# Patient Record
Sex: Female | Born: 1995 | Race: Black or African American | Hispanic: No | Marital: Single | Smoking: Current some day smoker
Health system: Southern US, Community
[De-identification: ages and names within clinical notes are randomized; demographics above are authoritative.]

## PROBLEM LIST (undated history)

## (undated) DIAGNOSIS — B977 Papillomavirus as the cause of diseases classified elsewhere: Secondary | ICD-10-CM

## (undated) HISTORY — PX: NO PAST SURGERIES: SHX2092

---

## 2016-09-03 ENCOUNTER — Encounter (HOSPITAL_COMMUNITY): Payer: Self-pay | Admitting: *Deleted

## 2016-09-03 ENCOUNTER — Inpatient Hospital Stay (HOSPITAL_COMMUNITY)
Admission: EM | Admit: 2016-09-03 | Discharge: 2016-09-04 | Disposition: A | Discharge status: Home / Self Care | Payer: Self-pay | Attending: Obstetrics and Gynecology | Admitting: Obstetrics and Gynecology

## 2016-09-03 DIAGNOSIS — S3141XA Laceration without foreign body of vagina and vulva, initial encounter: Secondary | ICD-10-CM | POA: Insufficient documentation

## 2016-09-03 DIAGNOSIS — N939 Abnormal uterine and vaginal bleeding, unspecified: Secondary | ICD-10-CM

## 2016-09-03 DIAGNOSIS — F17200 Nicotine dependence, unspecified, uncomplicated: Secondary | ICD-10-CM | POA: Insufficient documentation

## 2016-09-03 DIAGNOSIS — X58XXXA Exposure to other specified factors, initial encounter: Secondary | ICD-10-CM | POA: Insufficient documentation

## 2016-09-03 HISTORY — DX: Papillomavirus as the cause of diseases classified elsewhere: B97.7

## 2016-09-03 LAB — URINALYSIS, ROUTINE W REFLEX MICROSCOPIC
GLUCOSE, UA: NEGATIVE mg/dL
Ketones, ur: 15 mg/dL — AB
Nitrite: POSITIVE — AB
SPECIFIC GRAVITY, URINE: 1.02 (ref 1.005–1.030)
pH: 6.5 (ref 5.0–8.0)

## 2016-09-03 LAB — CBC
HCT: 35.3 % — ABNORMAL LOW (ref 36.0–46.0)
HEMOGLOBIN: 11.1 g/dL — AB (ref 12.0–15.0)
MCH: 25.1 pg — ABNORMAL LOW (ref 26.0–34.0)
MCHC: 31.4 g/dL (ref 30.0–36.0)
MCV: 79.7 fL (ref 78.0–100.0)
PLATELETS: 386 10*3/uL (ref 150–400)
RBC: 4.43 MIL/uL (ref 3.87–5.11)
RDW: 13.9 % (ref 11.5–15.5)
WBC: 8.2 10*3/uL (ref 4.0–10.5)

## 2016-09-03 LAB — POC URINE PREG, ED: PREG TEST UR: NEGATIVE

## 2016-09-03 LAB — COMPREHENSIVE METABOLIC PANEL
ALT: 18 U/L (ref 14–54)
ANION GAP: 8 (ref 5–15)
AST: 27 U/L (ref 15–41)
Albumin: 4.3 g/dL (ref 3.5–5.0)
Alkaline Phosphatase: 62 U/L (ref 38–126)
BUN: 10 mg/dL (ref 6–20)
CHLORIDE: 109 mmol/L (ref 101–111)
CO2: 22 mmol/L (ref 22–32)
CREATININE: 0.78 mg/dL (ref 0.44–1.00)
Calcium: 9.8 mg/dL (ref 8.9–10.3)
Glucose, Bld: 118 mg/dL — ABNORMAL HIGH (ref 65–99)
Potassium: 3.8 mmol/L (ref 3.5–5.1)
SODIUM: 139 mmol/L (ref 135–145)
Total Bilirubin: 0.3 mg/dL (ref 0.3–1.2)
Total Protein: 7.4 g/dL (ref 6.5–8.1)

## 2016-09-03 LAB — URINE MICROSCOPIC-ADD ON

## 2016-09-03 NOTE — ED Provider Notes (Signed)
[facility]-EMERGENCY DEPT Provider Note   CSN: [id number] Arrival date & time: 09/03/16  2012     History   Chief Complaint Chief Complaint  Patient presents with  . Vaginal Bleeding    HPI Gabriela Shannon is a 20 y.o. female with no major medical problems presents to the Emergency Department complaining of gradual, persistent, progressively worsening Vaginal pain and vaginal bleeding onset 7:45pm. Associated symptoms include clitoris pain.  Pt reports her menses ended on Friday.  She reports protected sexual intercourse this AM with spotting. Pt does report pain with intercourse this morning and a very small amount of bright red blood at the time.   She reports Immediately putting in a tampon in and keeping it there until this evening.  She reports she felt urinary urgency and noticed a large amount of blood in the toilet after removing the tampon.  She reports lower abd cramping and significant dysuria. Nothing makes it better and nothing makes it worse.  Pt denies fevers, chills headache, neck pain chest pain, SOB, nausea, vomiting.    The history is provided by the patient and medical records. No language interpreter was used.    History reviewed. No pertinent past medical history.  There are no active problems to display for this patient.   History reviewed. No pertinent surgical history.  OB History    No data available       Home Medications    Prior to Admission medications   Not on File    Family History No family history on file.  Social History Social History  Substance Use Topics  . Smoking status: Current Some Day Smoker  . Smokeless tobacco: Never Used  . Alcohol use No     Allergies   Patient has no known allergies.   Review of Systems Review of Systems  Genitourinary: Positive for dysuria, vaginal bleeding and vaginal pain.  All other systems reviewed and are negative.    Physical Exam Updated Vital Signs BP 124/76 (BP Location: Right Arm)    Pulse 74   Temp 98.2 F (36.8 C) (Oral)   Resp 16   Ht 5\' 7"  (1.702 m)   Wt 111.1 kg   LMP 08/31/2016   SpO2 99%   BMI 38.37 kg/m   Physical Exam  Constitutional: She appears well-developed and well-nourished. No distress.  HENT:  Head: Normocephalic and atraumatic.  Eyes: Conjunctivae are normal. No scleral icterus.  Neck: Normal range of motion. Neck supple.  Cardiovascular: Normal rate, regular rhythm and intact distal pulses.   Pulmonary/Chest: Effort normal and breath sounds normal. No respiratory distress. She has no wheezes.  Abdominal: Soft. Bowel sounds are normal. She exhibits no mass. There is no hepatosplenomegaly. There is no tenderness. There is no rebound, no guarding and no CVA tenderness.  Genitourinary: Uterus normal. Pelvic exam was performed with patient supine. There is no rash, tenderness or lesion on the right labia. There is no rash, tenderness or lesion on the left labia. Uterus is not tender. There is bleeding in the vagina. There are signs of injury in the vagina.  Genitourinary Comments: Large amount of bright red blood in the vaginal vault. Patient with exquisite pain on speculum exam. She is difficult to assess due to her weight and extreme tenderness. Unable to directly visualize the laceration however believe it to be located at the 10:00 position.  Vaginal vault packed with moist gauze.    Musculoskeletal: Normal range of motion. She exhibits no edema.  Lymphadenopathy:  She has no cervical adenopathy.  Neurological: She is alert.  Speech is clear and goal oriented Moves extremities without ataxia  Skin: Skin is warm and dry. No rash noted. She is not diaphoretic.  Psychiatric: She has a normal mood and affect.  Nursing note and vitals reviewed.    ED Treatments / Results  Labs (all labs ordered are listed, but only abnormal results are displayed)   Labs Reviewed  COMPREHENSIVE METABOLIC PANEL - Abnormal; Notable for the following:        Result Value   Glucose, Bld 118 (*)    All other components within normal limits  CBC - Abnormal; Notable for the following:    Hemoglobin 11.1 (*)    HCT 35.3 (*)    MCH 25.1 (*)    All other components within normal limits  URINALYSIS, ROUTINE W REFLEX MICROSCOPIC (NOT AT [facility]) - Abnormal; Notable for the following:    Color, Urine RED (*)    APPearance CLOUDY (*)    Hgb urine dipstick LARGE (*)    Bilirubin Urine MODERATE (*)    Ketones, ur 15 (*)    Protein, ur >300 (*)    Nitrite POSITIVE (*)    Leukocytes, UA MODERATE (*)    All other components within normal limits  URINE MICROSCOPIC-ADD ON - Abnormal; Notable for the following:    Squamous Epithelial / LPF 0-5 (*)    Bacteria, UA MANY (*)    All other components within normal limits  WET PREP, GENITAL  URINE CULTURE  POC URINE PREG, ED  GC/CHLAMYDIA PROBE AMP ([place]) NOT AT [facility]     Procedures Procedures (including critical care time)   Medications Ordered in ED Medications - No data to display   Initial Impression / Assessment and Plan / ED Course  I have reviewed the triage vital signs and the nursing notes.  Pertinent labs & imaging results that were available during my care of the patient were reviewed by me and considered in my medical decision making (see chart for details).  Clinical Course as of Sep 03 2344  Wynelle LinkSun Sep 03, 2016  2342 Discussed with Dr. Alysia PennaErvin who will evaluated in the MAU.  Pt requests to go via POV and I believe this is reasonable.  She is stable with stable vital signs.  [HM]  2343 Cuff was not seated correctly on pt's arm.  Manual BP 124/76 BP: (!) 88/67 [HM]  2343 Possible UTI.  Pt with complains of pain with urination.   Leukocytes, UA: (!) MODERATE [HM]  2343 Anemia.  Unknown baseline.  Her menses ended on 11/3 Hemoglobin: (!) 11.1 [HM]    Clinical Course User Index [HM] Dahlia ClientHannah Blandon Offerdahl, PA-C    Pt with vaginal bleeding. Large amount of bright red blood in vaginal vault.  Suspicious for vaginal tear likely at the 10:00 position based on exam.  Vaginal vault packed.  Discussed with OB/GYN who will see patient in the MAU. Patient wishes for transfer via POV. Vital signs are stable. Alert and oriented.  UA concerning for possible UTI.  Will allow MAU to decide about antibiotics.  Final Clinical Impressions(s) / ED Diagnoses   Final diagnoses:  Vaginal laceration, initial encounter  Vaginal bleeding    New Prescriptions New Prescriptions   No medications on file     Dierdre ForthHannah Idaliz Tinkle, PA-C 09/03/16 2352    Dione Boozeavid Glick, MD 09/04/16 [id number]

## 2016-09-03 NOTE — ED Triage Notes (Signed)
The pt has had vaginal bleeding for 20-30 minutes heavy with clots   She just finished her period  lmp 2 days ago

## 2016-09-04 ENCOUNTER — Inpatient Hospital Stay (HOSPITAL_COMMUNITY): Admission: AD | Admit: 2016-09-04 | Payer: Self-pay | Source: Ambulatory Visit | Admitting: Obstetrics and Gynecology

## 2016-09-04 ENCOUNTER — Encounter (HOSPITAL_COMMUNITY): Payer: Self-pay

## 2016-09-04 DIAGNOSIS — N939 Abnormal uterine and vaginal bleeding, unspecified: Secondary | ICD-10-CM

## 2016-09-04 MED ORDER — OXYCODONE-ACETAMINOPHEN 5-325 MG PO TABS
2.0000 | ORAL_TABLET | Freq: Once | ORAL | Status: AC
Start: 2016-09-04 — End: 2016-09-04
  Administered 2016-09-04: 2 via ORAL
  Filled 2016-09-04: qty 2

## 2016-09-04 NOTE — Discharge Instructions (Signed)
Vaginal Laceration °A vaginal laceration is a tear in the vaginal wall. A vaginal tear falls into one of three categories:  °1. Obstetrically related tears that occur at the time of childbirth. °2. Trauma-related tears (most often related to sexual intercourse). °3. Spontaneous tears. °Vaginal tears can cause heavy bleeding (hemorrhaging) depending on severity of the tear. Tears can be intensely tender and interfere with normal activities of living. They can make sexual intercourse painful and bring on significant burning with urination. If you have a vaginal laceration, the area around your vagina may be painful when you touch or wipe it. Even light pressure from clothing may cause some pain. Vaginal tear need to be evaluated by your caregiver.   °CAUSES  °· Obstetric-related causes, such as childbirth. °· Trauma that may result from an accident during an activity, such as sexual intercourse or a bicycle ride. °· Spontaneous causes related to aging, failed healing of a past obstetric tear, chronic irritation, or skin changes that are not well understood. °SYMPTOMS  °· Slight to heavy vaginal bleeding. °· Vaginal swelling. °· Mild to severe pain. °· Vaginal tenderness. °DIAGNOSIS  °If the tear happened during childbirth, your caregiver can diagnose the tear at that time. To diagnose a vaginal tear that happened spontaneously or because of trauma, your caregiver will perform a physical exam. During the physical exam, your caregiver may also look for any signs of trouble that may need further testing. If there is hemorrhaging, your caregiver may suggest blood tests to determine the extent of bleeding. Imaging tests may be performed, such as an ultrasonography or computed tomography (CT), to look for internal damage. A biopsy may be need if there are signs of a more serious problem.  °TREATMENT  °Treatment depends on the severity of the tear. For minor tears that heal on their own, treatment may only consist of keeping  the area clean and dry. Some tears need to be repaired with stitches. Other tears may heal on their own with help from various remedies, such as antibiotic ointments, medicated creams, or petroleum products. Depending on the circumstances, oral hormones may also be suggested. Hormone remedies may also be in the form of topical creams and vaginal tablets. For more concerning situations, hospitalization and surgical repair of the tear may be needed. °HOME CARE INSTRUCTIONS  °· Take warm-water baths that cover your hips and buttocks (sitz bath) 2 to 3 times a day. This may help any discomfort and swelling.   °· Only take over-the-counter or prescription medicines for pain, discomfort, or fever as directed by your caregiver. Do not use aspirin because it can cause increased bleeding.   °· Do not douche, use tampons, or have intercourse until your caregiver says it is okay.    °· A bandage (dressing) may have been applied. Change the dressing once a day or as directed. If the dressing sticks, soak it off with warm, soapy water.   °· Apply ice or witch hazel pads to the vagina to lessen any pain or discomfort.   °· Take a stool softener or follow a special diet as directed by your caregiver. This will help ease discomfort associated with bowel movements.   °SEEK IMMEDIATE MEDICAL CARE IF:  °· You have redness or swelling in the vaginal area.   °· You have increasing, sharp, or intense pain or tenderness in the vaginal area. °· You have pus or unusual discharge coming from the tear or vagina.   °· You notice a bad smell coming from the vagina.   °· Your tear breaks open after   it healed or was repaired.   °· You feel lightheaded. °· You have increasing abdominal pain.   °· You have an increasing or heavy amount of vaginal bleeding.   °· You have pain with intercourse after the tear heals.   °MAKE SURE YOU: °· Understand these instructions. °· Will watch your condition. °· Will get help right away if you are not doing well  or get worse. °  °This information is not intended to replace advice given to you by your health care provider. Make sure you discuss any questions you have with your health care provider. °  °Document Released: 10/16/2005 Document Revised: 07/10/2012 Document Reviewed: 03/04/2012 °Elsevier Interactive Patient Education ©2016 [employer] ° °

## 2016-09-04 NOTE — MAU Note (Signed)
Pt transferred here for evaluation of vaginal tear.

## 2016-09-04 NOTE — ED Notes (Signed)
Pt verbalized understanding to go directly to [facility].

## 2016-09-04 NOTE — MAU Note (Signed)
Pt presents from [facility] after going in there for vaginal pain and bleeding after intercourse this am.

## 2016-09-04 NOTE — MAU Provider Note (Signed)
Chief Complaint: Vaginal Bleeding   First Provider Initiated Contact with Patient 09/04/16 0213      SUBJECTIVE HPI: Gabriela Shannon is a 20 y.o. G0 who presents to maternity admissions [facility]. She reports she had intercourse this morning and notice spotting afterwards so she placed a tampon.  Her LMP was last week and she usually has regular periods with no bleeding in between.  A few hours after intercourse, she felt like the tampon was painful so she went to the bathroom and removed it and saw lots of blood in the toilet and when she wiped. She has not tried any treatments, nothing makes it better or worse.  The bleeding is associated with vaginal pain that is constant.  The bleeding is constant but has increased and decreased intermittently since onset.  The ED staff packed her vagina with sterile gauze before leaving the hospital earlier so she has only seen light spotting on her underwear since arriving in MAU. She denies vaginal itching/burning, urinary symptoms, h/a, dizziness, n/v, or fever/chills.     HPI  Past Medical History:  Diagnosis Date  . HPV (human papilloma virus) infection    Past Surgical History:  Procedure Laterality Date  . NO PAST SURGERIES     Social History   Social History  . Marital status: Single    Spouse name: N/A  . Number of children: N/A  . Years of education: N/A   Occupational History  . Not on file.   Social History Main Topics  . Smoking status: Current Some Day Smoker  . Smokeless tobacco: Never Used  . Alcohol use No  . Drug use: Unknown  . Sexual activity: Not on file   Other Topics Concern  . Not on file   Social History Narrative  . No narrative on file   No current facility-administered medications on file prior to encounter.    No current outpatient prescriptions on file prior to encounter.   No Known Allergies  ROS:  Review of Systems  Constitutional: Negative for chills, fatigue and  fever.  Respiratory: Negative for shortness of breath.   Cardiovascular: Negative for chest pain.  Gastrointestinal: Negative for nausea and vomiting.  Genitourinary: Positive for vaginal bleeding and vaginal pain. Negative for difficulty urinating, dysuria, flank pain, pelvic pain and vaginal discharge.  Neurological: Negative for dizziness and headaches.  Psychiatric/Behavioral: Negative.      I have reviewed patient's Past Medical Hx, Surgical Hx, Family Hx, Social Hx, medications and allergies.   Physical Exam   Patient Vitals for the past 24 hrs:  BP Temp Temp src Pulse Resp SpO2 Height Weight  09/04/16 0140 134/59 98.1 F (36.7 C) Oral 97 18 100 % - -  09/04/16 0007 (!) 150/130 - - 89 16 100 % - -  09/03/16 2222 124/76 - - - - - - -  09/03/16 2145 (!) 88/67 - - 74 - 99 % - -  09/03/16 2134 (!) 110/49 - - 94 - 100 % - -  09/03/16 2133 - 98.2 F (36.8 C) Oral 69 16 99 % - -  09/03/16 2018 - - - - - - 5\' 7"  (1.702 m) 245 lb (111.1 kg)  09/03/16 2017 129/76 98.1 F (36.7 C) Oral 90 14 97 % - -   Constitutional: Well-developed, well-nourished female in no acute distress.  Cardiovascular: normal rate Respiratory: normal effort GI: Abd soft, non-tender. Pos BS x 4 MS: Extremities nontender, no edema, normal ROM Neurologic:  Alert and oriented x 4.  GU: Neg CVAT.  PELVIC EXAM: Pt placed in stirrups for exam and gauze packing removed slowly by CNM.  Packing is soaked but not 100% saturated with blood.  No active bleeding noted immediately after removing guaze.  Pt with significant discomfort however to palpation so plan to premedicate with Percocet then perform SSE.   Visual inspection and SSE performed and small v-shaped laceration, 0.5 cm on each side of "v" to right periurethral area.  Vaginal walls/cervix wnl with scant pink bleeding.  No active bleeding from laceration.  Scant bleeding on pad in 2.5 hours since vaginal packing removed.     LAB RESULTS Results for orders  placed or performed during the hospital encounter of 09/03/16 (from the past 24 hour(s))  Comprehensive metabolic panel     Status: Abnormal   Collection Time: 09/03/16  8:22 PM  Result Value Ref Range   Sodium 139 135 - 145 mmol/L   Potassium 3.8 3.5 - 5.1 mmol/L   Chloride 109 101 - 111 mmol/L   CO2 22 22 - 32 mmol/L   Glucose, Bld 118 (H) 65 - 99 mg/dL   BUN 10 6 - 20 mg/dL   Creatinine, Ser 6.21 0.44 - 1.00 mg/dL   Calcium 9.8 8.9 - 30.8 mg/dL   Total Protein 7.4 6.5 - 8.1 g/dL   Albumin 4.3 3.5 - 5.0 g/dL   AST 27 15 - 41 U/L   ALT 18 14 - 54 U/L   Alkaline Phosphatase 62 38 - 126 U/L   Total Bilirubin 0.3 0.3 - 1.2 mg/dL   GFR calc non Af Amer >60 >60 mL/min   GFR calc Af Amer >60 >60 mL/min   Anion gap 8 5 - 15  CBC     Status: Abnormal   Collection Time: 09/03/16  8:22 PM  Result Value Ref Range   WBC 8.2 4.0 - 10.5 K/uL   RBC 4.43 3.87 - 5.11 MIL/uL   Hemoglobin 11.1 (L) 12.0 - 15.0 g/dL   HCT 65.7 (L) 84.6 - 96.2 %   MCV 79.7 78.0 - 100.0 fL   MCH 25.1 (L) 26.0 - 34.0 pg   MCHC 31.4 30.0 - 36.0 g/dL   RDW 95.2 84.1 - 32.4 %   Platelets 386 150 - 400 K/uL  Urinalysis, Routine w reflex microscopic     Status: Abnormal   Collection Time: 09/03/16  8:36 PM  Result Value Ref Range   Color, Urine RED (A) YELLOW   APPearance CLOUDY (A) CLEAR   Specific Gravity, Urine 1.020 1.005 - 1.030   pH 6.5 5.0 - 8.0   Glucose, UA NEGATIVE NEGATIVE mg/dL   Hgb urine dipstick LARGE (A) NEGATIVE   Bilirubin Urine MODERATE (A) NEGATIVE   Ketones, ur 15 (A) NEGATIVE mg/dL   Protein, ur >401 (A) NEGATIVE mg/dL   Nitrite POSITIVE (A) NEGATIVE   Leukocytes, UA MODERATE (A) NEGATIVE  Urine microscopic-add on     Status: Abnormal   Collection Time: 09/03/16  8:36 PM  Result Value Ref Range   Squamous Epithelial / LPF 0-5 (A) NONE SEEN   WBC, UA 6-30 0 - 5 WBC/hpf   RBC / HPF TOO NUMEROUS TO COUNT 0 - 5 RBC/hpf   Bacteria, UA MANY (A) NONE SEEN   Urine-Other LESS THAN 10 mL OF  URINE SUBMITTED   POC urine preg, ED     Status: None   Collection Time: 09/03/16  8:50 PM  Result Value Ref  Range   Preg Test, Ur NEGATIVE NEGATIVE       IMAGING No results found.  MAU Management/MDM: Reviewed labs and exam notes from ED.  Likely vaginal laceration causing bleeding, now hemostatic with vaginal packing and time.  Consult Dr Alysia PennaErvin. D/C home to use ice/pressure applied with pad. Return to MAU if bleeding worsens or persists.  Peribottle given to pt for comfort when urinating due to location of laceration.  Treatments in MAU included Percocet 5/325 x 2 tabs. Pt stable at time of discharge.  ASSESSMENT 1. Vaginal laceration, initial encounter   2. Vaginal bleeding     PLAN Discharge home with bleeding precautions    Medication List    You have not been prescribed any medications.    Follow-up Information    [facility] to [facility].   Why:  Return to MAU as needed for emergencies Contact information: [address] [id number] [place]c [place] [phone]          Sharen CounterLisa Leftwich-Kirby Certified Nurse-Midwife 09/04/2016  4:21 AM

## 2016-09-06 LAB — URINE CULTURE

## 2016-09-07 ENCOUNTER — Telehealth: Payer: Self-pay

## 2016-09-07 MED ORDER — CIPROFLOXACIN HCL 250 MG PO TABS: 250.0000 mg | ORAL_TABLET | Freq: Two times a day (BID) | ORAL | 0 refills | Status: DC

## 2016-09-07 NOTE — Telephone Encounter (Signed)
Per Alysia PennaErvin, pt has a UTI and needs Cipro 250 mg po bid x 7 days for treatment.  Notified pt of UTI and tx.  Pt stated understanding.

## 2017-02-21 ENCOUNTER — Emergency Department (HOSPITAL_COMMUNITY)
Admission: EM | Admit: 2017-02-21 | Discharge: 2017-02-21 | Disposition: A | Discharge status: Home / Self Care | Payer: BLUE CROSS/BLUE SHIELD | Attending: Emergency Medicine | Admitting: Emergency Medicine

## 2017-02-21 ENCOUNTER — Encounter (HOSPITAL_COMMUNITY): Payer: Self-pay | Admitting: Emergency Medicine

## 2017-02-21 DIAGNOSIS — Y939 Activity, unspecified: Secondary | ICD-10-CM | POA: Diagnosis not present

## 2017-02-21 DIAGNOSIS — S0501XA Injury of conjunctiva and corneal abrasion without foreign body, right eye, initial encounter: Secondary | ICD-10-CM | POA: Diagnosis not present

## 2017-02-21 DIAGNOSIS — S0993XA Unspecified injury of face, initial encounter: Secondary | ICD-10-CM | POA: Diagnosis present

## 2017-02-21 DIAGNOSIS — Y999 Unspecified external cause status: Secondary | ICD-10-CM | POA: Insufficient documentation

## 2017-02-21 DIAGNOSIS — W268XXA Contact with other sharp object(s), not elsewhere classified, initial encounter: Secondary | ICD-10-CM | POA: Insufficient documentation

## 2017-02-21 DIAGNOSIS — Y9289 Other specified places as the place of occurrence of the external cause: Secondary | ICD-10-CM | POA: Diagnosis not present

## 2017-02-21 DIAGNOSIS — F17200 Nicotine dependence, unspecified, uncomplicated: Secondary | ICD-10-CM | POA: Diagnosis not present

## 2017-02-21 MED ORDER — BACITRACIN-POLYMYXIN B 500-10000 UNIT/GM OP OINT: 1.0000 "application " | TOPICAL_OINTMENT | Freq: Three times a day (TID) | OPHTHALMIC | 0 refills | Status: DC

## 2017-02-21 MED ORDER — TETRACAINE HCL 0.5 % OP SOLN
2.0000 [drp] | Freq: Once | OPHTHALMIC | Status: AC
  Administered 2017-02-21: 2 [drp] via OPHTHALMIC
  Filled 2017-02-21: qty 4

## 2017-02-21 MED ORDER — FLUORESCEIN SODIUM 0.6 MG OP STRP
1.0000 | ORAL_STRIP | Freq: Once | OPHTHALMIC | Status: AC
  Administered 2017-02-21: 1 via OPHTHALMIC
  Filled 2017-02-21: qty 1

## 2017-02-21 NOTE — ED Triage Notes (Signed)
Patient c/o unable to open either eye. Pt states around 11 this morning scratching her right eye and now is unable to open either eyes. States uncomfortable feeling, painful when in sunlight.   Pt reports this happened before, scratched her cornea and felt similar.

## 2017-02-21 NOTE — ED Provider Notes (Signed)
[facility]-EMERGENCY DEPT Provider Note   CSN: [id number] Arrival date & time: 02/21/17  1409  By signing my name below, I, Phillips Climes, attest that this documentation has been prepared under the direction and in the presence of Tadan Shill, [place].  Electronically Signed: Phillips Climes, Scribe. 02/21/2017. 5:36 PM.  History   Chief Complaint Chief Complaint  Patient presents with  . Eye Pain   HPI Comments:  Gabriela Shannon is a 21 y.o. female who presents to the Emergency Department with complaints of being unable to open either of her eyes, after scratching her right eye with her fingernail x5 hours ago. She describes her right eye pain as dull, worsening and constant, rated a 6/10 in severity. There is no pain or discomfort in the left eye. She additionally endorses photophobia, and is unable to confirm or deny any changes in visual acuity. Pt reports that pain is similar to when she scratched her cornea, however, of greater magnitude.  Pt attempted to rinse eye with water, but used no OTC medications for pain relief.   Pt denies experiencing any other acute sx, including fever, chills, nausea or vomiting.   The history is provided by the patient and medical records. No language interpreter was used.    Past Medical History:  Diagnosis Date  . HPV (human papilloma virus) infection     There are no active problems to display for this patient.   Past Surgical History:  Procedure Laterality Date  . NO PAST SURGERIES      OB History    Gravida Para Term Preterm AB Living   0 0 0 0 0 0   SAB TAB Ectopic Multiple Live Births   0 0 0 0 0       Home Medications    Prior to Admission medications   Medication Sig Start Date End Date Taking? Authorizing Provider  bacitracin-polymyxin b (POLYSPORIN) ophthalmic ointment Place 1 application into the right eye 3 (three) times daily. apply to eye three times a day 02/21/17   [facility] [place], [place]  ciprofloxacin (CIPRO)  250 MG tablet Take 1 tablet (250 mg total) by mouth 2 (two) times daily. 09/07/16   Hermina Staggers, MD    Family History No family history on file.  Social History Social History  Substance Use Topics  . Smoking status: Current Some Day Smoker  . Smokeless tobacco: Never Used  . Alcohol use No     Allergies   Patient has no known allergies.   Review of Systems Review of Systems  Constitutional: Negative for chills and fever.  Eyes: Positive for photophobia and pain.  Gastrointestinal: Negative for nausea and vomiting.     Physical Exam Updated Vital Signs BP 107/86 (BP Location: Left Arm)   Pulse 92   Resp 14   Ht  (1.702 m)   Wt 113.4 kg   LMP 01/22/2017   SpO2 100%   BMI 39.16 kg/m   Physical Exam  Constitutional: She appears well-developed and well-nourished.  Uncomfortable. Photophobic on exam.  HENT:  Head: Normocephalic and atraumatic.  Nose: Nose normal.  Eyes: EOM are normal. Pupils are equal, round, and reactive to light.  Slit lamp exam:      The right eye shows corneal abrasion and fluorescein uptake. The right eye shows no foreign body.    Tearful. Unable to open eyes for long period of time due to pain and discomfort. No injection noted. No foreign body noted on eye  exam and lid eversion. Negative Seidel sign.   Neck: Normal range of motion.  Cardiovascular: Normal rate.   Pulmonary/Chest: Effort normal. No respiratory distress.  Normal work of breathing. No respiratory distress noted.   Abdominal: Soft.  Musculoskeletal: Normal range of motion.  Neurological: She is alert.  Skin: Skin is warm. Capillary refill takes less than 2 seconds.  Psychiatric: She has a normal mood and affect. Her behavior is normal.  Nursing note and vitals reviewed.    ED Treatments / Results  DIAGNOSTIC STUDIES:  Oxygen Saturation is 100% on RA, normal by my interpretation.    COORDINATION OF CARE:  3:51 PM Discussed treatment plan with pt at bedside  and pt agreed to plan.  Labs (all labs ordered are listed, but only abnormal results are displayed) Labs Reviewed - No data to display  EKG  EKG Interpretation None       Radiology No results found.  Procedures Procedures (including critical care time)  Medications Ordered in ED Medications  fluorescein ophthalmic strip 1 strip (not administered)  tetracaine (PONTOCAINE) 0.5 % ophthalmic solution 2 drop (not administered)     Initial Impression / Assessment and Plan / ED Course  I have reviewed the triage vital signs and the nursing notes.  Pertinent labs & imaging results that were available during my care of the patient were reviewed by me and considered in my medical decision making (see chart for details).    Corneal abrasion  Pt with corneal abrasion on PE and fluorescein stain.  Eye irrigated w NS, no evidence of FB.  No change in vision, acuity equal bilaterally.  Pt is not a contact lens wearer.  Exam non-concerning for orbital cellulitis, hyphema, corneal ulcers. Patient will be discharged home with antibiotic ointment.   Patient understands to follow up with ophthalmology, & to return to ER if new symptoms develop including change in vision, purulent drainage, or entrapment. Dr. Tegeler also saw and evaluaRush Landmarkpt and agrees with assessment and plan.  I spoke with Dr. Randon Goldsmith who recommended bacitracin or polybac ointment , cold compress, chilled artificial tears and for her to be seen tomorrow or Friday morning.    Final Clinical Impressions(s) / ED Diagnoses   Final diagnoses:  Abrasion of right cornea, initial encounter    New Prescriptions New Prescriptions   BACITRACIN-POLYMYXIN B (POLYSPORIN) OPHTHALMIC OINTMENT    Place 1 application into the right eye 3 (three) times daily. apply to eye three times a day   I personally performed the services described in this documentation, which was scribed in my presence. The recorded information has been reviewed and  is accurate.   [address] [place], [place] 02/21/17 1736    Canary Brim Tegeler, MD 02/22/17 [phone]

## 2017-02-21 NOTE — Discharge Instructions (Signed)
Please take ophthalmic ointment 3 times a day for 4 days. Please follow-up with Dr. Randon Goldsmith tomorrow at his office. His cold compress to the eye and chilled artificial tears.  Contact a health care provider if: You continue to have eye pain and other symptoms for more than 2 days. You develop new symptoms, such as redness, tearing, or discharge. You have discharge that makes your eyelids stick together in the morning. Your eye patch becomes so loose that you can blink your eye. Symptoms return after the original abrasion has healed. Get help right away if: You have severe eye pain that does not get better with medicine. You have vision loss.

## 2017-12-05 ENCOUNTER — Encounter (HOSPITAL_BASED_OUTPATIENT_CLINIC_OR_DEPARTMENT_OTHER): Payer: Self-pay | Admitting: Emergency Medicine

## 2017-12-05 ENCOUNTER — Encounter (HOSPITAL_COMMUNITY): Payer: Self-pay | Admitting: Emergency Medicine

## 2017-12-05 ENCOUNTER — Emergency Department (HOSPITAL_BASED_OUTPATIENT_CLINIC_OR_DEPARTMENT_OTHER)
Admission: EM | Admit: 2017-12-05 | Discharge: 2017-12-05 | Disposition: A | Discharge status: Home / Self Care | Payer: No Typology Code available for payment source | Attending: Emergency Medicine | Admitting: Emergency Medicine

## 2017-12-05 ENCOUNTER — Emergency Department (HOSPITAL_BASED_OUTPATIENT_CLINIC_OR_DEPARTMENT_OTHER): Payer: No Typology Code available for payment source

## 2017-12-05 ENCOUNTER — Emergency Department (HOSPITAL_COMMUNITY)
Admission: EM | Admit: 2017-12-05 | Discharge: 2017-12-05 | Discharge status: Against Medical Advice | Payer: BLUE CROSS/BLUE SHIELD | Attending: Emergency Medicine | Admitting: Emergency Medicine

## 2017-12-05 ENCOUNTER — Other Ambulatory Visit: Payer: Self-pay

## 2017-12-05 DIAGNOSIS — Z79899 Other long term (current) drug therapy: Secondary | ICD-10-CM | POA: Insufficient documentation

## 2017-12-05 DIAGNOSIS — F17200 Nicotine dependence, unspecified, uncomplicated: Secondary | ICD-10-CM | POA: Diagnosis not present

## 2017-12-05 DIAGNOSIS — S8982XA Other specified injuries of left lower leg, initial encounter: Secondary | ICD-10-CM | POA: Diagnosis present

## 2017-12-05 DIAGNOSIS — S8012XA Contusion of left lower leg, initial encounter: Secondary | ICD-10-CM | POA: Diagnosis not present

## 2017-12-05 DIAGNOSIS — Y92410 Unspecified street and highway as the place of occurrence of the external cause: Secondary | ICD-10-CM | POA: Insufficient documentation

## 2017-12-05 DIAGNOSIS — R51 Headache: Secondary | ICD-10-CM | POA: Diagnosis not present

## 2017-12-05 DIAGNOSIS — Y9389 Activity, other specified: Secondary | ICD-10-CM | POA: Diagnosis not present

## 2017-12-05 DIAGNOSIS — Z5321 Procedure and treatment not carried out due to patient leaving prior to being seen by health care provider: Secondary | ICD-10-CM | POA: Insufficient documentation

## 2017-12-05 DIAGNOSIS — S46911A Strain of unspecified muscle, fascia and tendon at shoulder and upper arm level, right arm, initial encounter: Secondary | ICD-10-CM

## 2017-12-05 DIAGNOSIS — Y999 Unspecified external cause status: Secondary | ICD-10-CM | POA: Diagnosis not present

## 2017-12-05 MED ORDER — NAPROXEN 500 MG PO TABS: 500.0000 mg | ORAL_TABLET | Freq: Two times a day (BID) | ORAL | 0 refills | Status: DC

## 2017-12-05 MED ORDER — IBUPROFEN 800 MG PO TABS
800.0000 mg | ORAL_TABLET | Freq: Once | ORAL | Status: AC
  Administered 2017-12-05: 800 mg via ORAL
  Filled 2017-12-05: qty 1

## 2017-12-05 MED ORDER — ACETAMINOPHEN 500 MG PO TABS
1000.0000 mg | ORAL_TABLET | Freq: Once | ORAL | Status: AC
Start: 2017-12-05 — End: 2017-12-05
  Administered 2017-12-05: 1000 mg via ORAL
  Filled 2017-12-05: qty 2

## 2017-12-05 MED ORDER — CYCLOBENZAPRINE HCL 10 MG PO TABS: 10.0000 mg | ORAL_TABLET | Freq: Three times a day (TID) | ORAL | 0 refills | Status: DC | PRN
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
## 2017-12-05 NOTE — ED Notes (Signed)
Pt discharged to home NAD.  
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
## 2017-12-07 ENCOUNTER — Emergency Department (HOSPITAL_COMMUNITY): Payer: No Typology Code available for payment source

## 2017-12-07 ENCOUNTER — Encounter (HOSPITAL_COMMUNITY): Payer: Self-pay

## 2017-12-07 ENCOUNTER — Emergency Department (HOSPITAL_COMMUNITY)
Admission: EM | Admit: 2017-12-07 | Discharge: 2017-12-07 | Disposition: A | Discharge status: Home / Self Care | Payer: No Typology Code available for payment source | Attending: Emergency Medicine | Admitting: Emergency Medicine

## 2017-12-07 DIAGNOSIS — R220 Localized swelling, mass and lump, head: Secondary | ICD-10-CM | POA: Diagnosis not present

## 2017-12-07 DIAGNOSIS — S0990XA Unspecified injury of head, initial encounter: Secondary | ICD-10-CM | POA: Insufficient documentation

## 2017-12-07 DIAGNOSIS — Z79899 Other long term (current) drug therapy: Secondary | ICD-10-CM | POA: Diagnosis not present

## 2017-12-07 DIAGNOSIS — Y998 Other external cause status: Secondary | ICD-10-CM | POA: Diagnosis not present

## 2017-12-07 DIAGNOSIS — F1210 Cannabis abuse, uncomplicated: Secondary | ICD-10-CM | POA: Diagnosis not present

## 2017-12-07 DIAGNOSIS — R51 Headache: Secondary | ICD-10-CM | POA: Diagnosis not present

## 2017-12-07 DIAGNOSIS — S060X0D Concussion without loss of consciousness, subsequent encounter: Secondary | ICD-10-CM | POA: Diagnosis not present

## 2017-12-07 DIAGNOSIS — F17210 Nicotine dependence, cigarettes, uncomplicated: Secondary | ICD-10-CM | POA: Diagnosis not present

## 2017-12-07 DIAGNOSIS — Y92410 Unspecified street and highway as the place of occurrence of the external cause: Secondary | ICD-10-CM | POA: Diagnosis not present

## 2017-12-07 DIAGNOSIS — Y9389 Activity, other specified: Secondary | ICD-10-CM | POA: Insufficient documentation

## 2017-12-07 DIAGNOSIS — T148XXA Other injury of unspecified body region, initial encounter: Secondary | ICD-10-CM | POA: Diagnosis not present

## 2017-12-07 LAB — BASIC METABOLIC PANEL WITH GFR
Anion gap: 10 (ref 5–15)
BUN: 9 mg/dL (ref 6–20)
CO2: 24 mmol/L (ref 22–32)
Calcium: 9.4 mg/dL (ref 8.9–10.3)
Chloride: 105 mmol/L (ref 101–111)
Creatinine, Ser: 0.68 mg/dL (ref 0.44–1.00)
GFR calc Af Amer: >60 mL/min
GFR calc non Af Amer: >60 mL/min
Glucose, Bld: 92 mg/dL (ref 65–99)
Potassium: 4.2 mmol/L (ref 3.5–5.1)
Sodium: 139 mmol/L (ref 135–145)

## 2017-12-07 LAB — CBC WITH DIFFERENTIAL/PLATELET
Basophils Absolute: 0 K/uL (ref 0.0–0.1)
Basophils Relative: 0 %
Eosinophils Absolute: 0 K/uL (ref 0.0–0.7)
Eosinophils Relative: 1 %
HCT: 35 % — ABNORMAL LOW (ref 36.0–46.0)
Hemoglobin: 10.7 g/dL — ABNORMAL LOW (ref 12.0–15.0)
Lymphocytes Relative: 46 %
Lymphs Abs: 2.8 K/uL (ref 0.7–4.0)
MCH: 25.2 pg — ABNORMAL LOW (ref 26.0–34.0)
MCHC: 30.6 g/dL (ref 30.0–36.0)
MCV: 82.4 fL (ref 78.0–100.0)
Monocytes Absolute: 0.7 K/uL (ref 0.1–1.0)
Monocytes Relative: 11 %
Neutro Abs: 2.5 K/uL (ref 1.7–7.7)
Neutrophils Relative %: 42 %
Platelets: 329 K/uL (ref 150–400)
RBC: 4.25 MIL/uL (ref 3.87–5.11)
RDW: 14.2 % (ref 11.5–15.5)
WBC: 6.1 K/uL (ref 4.0–10.5)

## 2017-12-07 LAB — I-STAT BETA HCG BLOOD, ED (MC, WL, AP ONLY): I-stat hCG, quantitative: <5 m[IU]/mL

## 2017-12-07 MED ORDER — IOPAMIDOL (ISOVUE-300) INJECTION 61%
INTRAVENOUS | Status: AC
  Administered 2017-12-07: 75 mL
  Filled 2017-12-07: qty 75
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
## 2017-12-07 NOTE — ED Notes (Signed)
Pt reporting returnning of headache
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
## 2017-12-13 ENCOUNTER — Telehealth: Payer: Self-pay
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
## 2017-12-17 ENCOUNTER — Ambulatory Visit (INDEPENDENT_AMBULATORY_CARE_PROVIDER_SITE_OTHER): Payer: Self-pay | Admitting: Family Medicine

## 2017-12-17 ENCOUNTER — Encounter: Payer: Self-pay | Admitting: Family Medicine

## 2017-12-17 DIAGNOSIS — S060X9A Concussion with loss of consciousness of unspecified duration, initial encounter: Secondary | ICD-10-CM | POA: Insufficient documentation

## 2017-12-17 DIAGNOSIS — S060XAA Concussion with loss of consciousness status unknown, initial encounter: Secondary | ICD-10-CM | POA: Insufficient documentation

## 2017-12-17 DIAGNOSIS — S060X0A Concussion without loss of consciousness, initial encounter: Secondary | ICD-10-CM

## 2017-12-17 NOTE — Patient Instructions (Signed)
Good to see you I do think you have a concussion.  I would like you to limit screen time (including your phone) to 30 minutes daily  No sport until you are back in school with no symptoms.  You may then start the return to play protocol I am giving you.  In addition to this I recommend......  To help improve COGNITIVE function: Using fish oil/omega 3 that is 1000 mg (or roughly 600 mg EPA/DHA), starting as soon as possible after concussion, take: 3 tabs ONCE DAILY  for the next 10 days then 2 grams daily thereafter   To help reduce HEADACHES: Coenzyme Q10 160mg  ONCE DAILY Vitamin D 2000 IU daily     I want to see you again in 1 weeks

## 2017-12-17 NOTE — Assessment & Plan Note (Signed)
Mild concussion.  At this time we will continue more of a natural, holistic approach, we discussed over-the-counter medication, avoiding certain activity, hold patient out of work for 1 more week.  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  Patient will follow up with me again in 1 week and hopefully will be able to get patient back to her regular routine.

## 2017-12-21 DIAGNOSIS — Z0289 Encounter for other administrative examinations: Secondary | ICD-10-CM

## 2017-12-22 NOTE — Progress Notes (Deleted)
Subjective:   @VITALSMCOMMENTS @  Chief Complaint: Gabriela Shannon, DOB: 10-29-1996, is a 22 y.o. female who presents for No chief complaint on file.   Injury date : *** Visit #: ***  History of Present Illness:   Patient's goals/priorities: ***  CLASS CURRENT GRADE COMMENTS                               Concussion Self-Reported Symptom Score Symptoms rated on a scale 1-6, in last 24 hours  Headache: ***    Nausea: ***  Vomiting: ***  Balance Difficulty: ***   Dizziness: ***  Fatigue: ***  Trouble Falling Asleep: ***   Sleep More Than Usual: ***  Sleep Less Than Usual: ***  Daytime Drowsiness: ***  Photophobia: ***  Phonophobia: ***  Feeling anxious: ***  Confused: ***  Irritability: ***  Sadness: ***  Nervousness: ***  Feeling More Emotional: ***  Numbness or Tingling: ***  Feeling Slowed Down: ***  Feeling Mentally Foggy: ***  Difficulty Concentrating: ***  Difficulty Remembering: ***  Visual Problems: ***  Neck Pain: ***  Tinnitus: ***   Total Symptom Score: *** Previous Symptom Score: ***  Review of Systems: Pertinent items are noted in HPI.  Review of History: Past Medical History: @PMHP @  Past Surgical History:  has a past surgical history that includes No past surgeries. Family History: family history is not on file. Social History:  reports that she has been smoking cigarettes.  She has been smoking about 0.20 packs per day. she has never used smokeless tobacco. She reports that she drinks alcohol. She reports that she uses drugs. Drug: Marijuana. Current Medications: has a current medication list which includes the following prescription(s): bacitracin-polymyxin b, ciprofloxacin, cyclobenzaprine, and naproxen. Allergies: has No Known Allergies.  Objective:    Physical Examination There were no vitals filed for this visit. General appearance: alert, appears stated age and cooperative Head: Normocephalic, without obvious abnormality,  atraumatic Eyes: conjunctivae/corneas clear. PERRL, EOM's intact. Fundi benign. Sclera anicteric. Lungs: clear to auscultation bilaterally and percussion Heart: regular rate and rhythm, S1, S2 normal, no murmur, click, rub or gallop Neurologic: CN 2-12 normal.  Sensation to pain, touch, and proprioception normal.  DTRs  normal in upper and lower extremities. No pathologic reflexes. Neg rhomberg, modified rhomberg, pronator drift, tandem gait, finger-to-nose; see post-concussion vestibular and oculomotor testing in chart Psychiatric: Oriented X3, intact recent and remote memory, judgement and insight, normal mood and affect  Concussion testing performed today:  Neurocognitive testing (ImPACT):  Baseline:*** Post #1: ***   Verbal Memory Composite *** (***%) *** (***%)   Visual Memory Composite *** (***%) *** (***%)   Visual Motor Speed Composite *** (***%) *** (***%)   Reaction Time Composite *** (***%) *** (***%)   Cognitive Efficiency Index *** ***     Vestibular Screening:   Pre VOMS  HA Score: *** Pre VOMS  Dizziness Score: ***   Headache  Dizziness  Smooth Pursuits *** ***  H. Saccades *** ***  V. Saccades *** ***  H. VOR *** ***  V. VOR *** ***  Visual Motor Sensitivity *** ***  Accommodation Right: *** cm Left: *** cm *** ***  Convergence: *** cm Divergence: *** cm *** ***   Balance Screen: ***  Additional testing performed today: { :28529}   Assessment:    No diagnosis found.  Gabriela Shannon presents with the following concussion subtypes. [] Cognitive [] Cervical [] Vestibular [] Ocular [] Migraine [] Anxiety/Mood   Plan:  Action/Discussion: Reviewed diagnosis, management options, expected outcomes, and the reasons for scheduled and emergent follow-up. Questions were adequately answered. Patient expressed verbal understanding and agreement with the following plan.      Participation in school/work: Patient is cleared to return to work/school and activities of  daily living without restrictions.  Patient is not cleared to return to work/school until further notice.  Patient may return to work/school on ***, with the following restrictions/supports:  Length of Day:  Please allow patient to use *** class as study hall in a quiet area.  Shortened school/work day: Recommend *** until ***.  Recommend core classes only.  Extra Time:  Take mental rest breaks during the day as needed. Check for return of symptoms when participating in any activities that require a significant amount of attention or concentration.  Allow extra time to complete tasks.  Please allow *** weeks to make up missed assignments, test, quizzes.  Visual/Vestibular Accommodations in School:  Allow patient to eat lunch in quiet environment with 1-2 classmates.  Allow patient to leave class 5 minutes before end of period to avoid busy/noisy hallway.  Please provide any supplemental learning materials (power points, lecture notes, handouts, etc) in minimum size 18 font and allow/provide any auditory supplements to learning when possible (books on tape, audio tape lectures, etc) to limit visual stress in the classroom.  Patient is cleared for auditory participation only. Patient is not cleared for homework, quizzes, or tests at this time.   Testing:  May begin taking tests/quizzes on *** with no more than one test/quiz per day.   No significant classroom or standardized testing until ***.  Home/Extracurricular:  Lessen work/homework load to allow adequate cognitive rest. Work *** minutes with intervals of *** minute breaks (total *** hours).  Limit visual stimulants including: driving, watching television/movies, reading, using cell phone, etc. - to ensure relative visual cognitive rest. NOT cleared for video or phone games. May participate *** minutes with intervals of *** minute breaks (total *** hours).   Participation in physical activity: Patient is cleared to  return to physical activity participation without restrictions.  Patient is not cleared for formal physical activity (includes physical education class, sports practices, sports games, weight training, etc) at this time.   However, we recommend that patient has 20-30 minutes of light cardiovascular activity daily, with NO risk of head injury (example: walking), staying below level of symptoms.  Recommend gradual progression to *** vestibular exercise (30-45 min/day) with no risk of head trauma while staying below level of symptoms.  See your exercise treatment menu for details.   Begin your exercise prescription on ____________ (see separate exercise prescription form)  Cleared for physical activity that poses NO RISK of head trauma.   Gradual return to physical activity under the supervision of a physician and/or athletic trainer  Once asymptomatic for 24 hours, patient may start Stage *** of [facility]PSM's { :10024} Exercise Progression Protocol. This is to be monitored by patient's { :28383}    Patient may start Stage *** of [facility]PSM's { :10024} Exercise Progression Protocol. This is to be monitored by patient's { :28383}.   Patient is not cleared for full contact activities, activities with risk of head trauma or unsupervised physical activity while participating in CFPSM's Exercise Progression. Check for return of symptoms (using Concussion Education Form Symptom List) when participating in activity and 24 hours following. If symptoms return, patient to contact our office for further recommendations.    [facility] Medical Recommendations form has been  given to patient allowing *** to progress patient back into full participation.  Active Treatment Strategies:  Fueling your brain is important for recovery. It is essential to stay well hydrated, aiming for half of your body weight in fluid ounces per day (100 lbs = 50 oz). We also recommend eating breakfast to start your day and focus on a  well-balanced diet containing lean protein, 'good' fats, and complex carbohydrates. See your nutrition / hydration handout for more details.   Quality sleep is vital in your concussion recovery. We encourage lots of sleep for the first 24-72 hours after injury but following this period it is important to regulate your sleep cycle. We encourage *** hours of quality sleep per night. See your sleep handout for more details and strategies to quality sleep.  IF NOT USING THE OPTIONS BELOW DELETE THEM  Treating your vestibular and visual dysfunction will decrease your recovery time and improve your symptoms. Begin your home vestibular exercise program as directed on your AVS.    Begin taking Amantadine medicine as directed.   Begin taking DHA supplement as directed.    Begin home exercise program for neck as directed.   Follow-up information:  Follow up appointment at [facility] in *** .   Call [facility] at [phone] at least 24 hours after completion of Stage 4 with status update.  Patient needs to arrive 30 minutes prior to appointment to complete the following tests: { :28378}.    Patient Education:  Reviewed with patient the risks (i.e, a repeat concussion, post-concussion syndrome, second-impact syndrome) of returning to play prior to complete resolution, and thoroughly reviewed the signs and symptoms of concussion.Reviewed need for complete resolution of all symptoms, with rest AND exertion, prior to return to play.  Reviewed red flags for urgent medical evaluation: worsening symptoms, nausea/vomiting, intractable headache, musculoskeletal changes, focal neurological deficits.  [facility] Concussion Care Plan, which clearly outlines the plans stated above, was given to patient.  I was personally involved with the physical evaluation of and am in agreement with the assessment and treatment plan for this patient.  Greater than 50% of this  encounter was spent in direct consultation with the patient in evaluation, counseling, and coordination of care. Duration of encounter: { :28531} minutes.  After Visit Summary printed out and provided to patient as appropriate.  This note is written by Judi SaaZachary M Smith, in the presence of and acting as the scribe of Judi SaaZachary M Smith, DO.

## 2017-12-24 ENCOUNTER — Ambulatory Visit: Payer: Self-pay | Admitting: Family Medicine

## 2018-02-06 ENCOUNTER — Encounter (HOSPITAL_COMMUNITY): Payer: Self-pay | Admitting: Family Medicine

## 2018-02-06 ENCOUNTER — Ambulatory Visit (HOSPITAL_COMMUNITY)
Admission: EM | Admit: 2018-02-06 | Discharge: 2018-02-06 | Disposition: A | Discharge status: Home / Self Care | Payer: Self-pay | Attending: Family Medicine | Admitting: Family Medicine

## 2018-02-06 DIAGNOSIS — R197 Diarrhea, unspecified: Secondary | ICD-10-CM

## 2018-02-06 MED ORDER — AZITHROMYCIN 500 MG PO TABS: ORAL_TABLET | ORAL | 0 refills | Status: DC

## 2018-02-06 NOTE — ED Triage Notes (Signed)
Pt here for diarrhea x 10 days. She is using the bathroom 20 to 25 times a day and it is water consistency. She is eating solid food. sts that she has been feeling over heated. sts that her legs are falling. No recent travels, no sick contacts, no recent abx use.

## 2018-02-06 NOTE — ED Provider Notes (Signed)
[facility]   [id number] 02/06/18 Arrival Time: 1724  ASSESSMENT & PLAN:  1. Diarrhea of presumed infectious origin    Meds ordered this encounter  Medications  . azithromycin (ZITHROMAX) 500 MG tablet    Sig: Take 2 tablets on day 1, then take 1 tablet on days 2-7.    Dispense:  8 tablet    Refill:  0   Does not have insurance. Will treat empirically for infectious cause. Imodium sparingly as needed. May f/u here if not seeing improvement. Will do her best to ensure adequate fluid intake in order to avoid dehydration. Will proceed to the Emergency Department for evaluation if unable to tolerate PO fluids regularly.  Reviewed expectations re: course of current medical issues. Questions answered. Outlined signs and symptoms indicating need for more acute intervention. Patient verbalized understanding. After Visit Summary given.   SUBJECTIVE: History from: patient.  Gabriela Shannon is a 22 y.o. female who presents with complaint of non-bloody persistent diarrhea for the past 10 days. Reports at least 20 small watery episodes per day. Tolerating PO intake. Somewhat worse after eating. Afebrile. Abdominal discomfort associated; "cramping" in nature. No sick contacts. No recent travel or camping. No recent antibiotic use. No urinary symptoms. No self treatment.  Patient's last menstrual period was 02/03/2018.  Past Surgical History:  Procedure Laterality Date  . NO PAST SURGERIES      ROS: As per HPI.  OBJECTIVE:  Vitals:   02/06/18 1755  BP: 118/69  Pulse: 87  Resp: 18  Temp: 98.3 F (36.8 C)  SpO2: 100%    General appearance: alert; no distress Oropharynx: moist Lungs: clear to auscultation bilaterally Heart: regular rate and rhythm Abdomen: soft; non-distended; no significant abdominal tenderness, "cramping feeling"; bowel sounds present; no masses or organomegaly; no guarding or rebound tenderness Back: no CVA tenderness Extremities: no edema;  symmetrical with no gross deformities Skin: warm and dry Neurologic: normal gait Psychological: alert and cooperative; normal mood and affect   No Known Allergies                                             Past Medical History:  Diagnosis Date  . HPV (human papilloma virus) infection    Social History   Socioeconomic History  . Marital status: Single    Spouse name: Not on file  . Number of children: Not on file  . Years of education: Not on file  . Highest education level: Not on file  Occupational History  . Not on file  Social Needs  . Financial resource strain: Not on file  . Food insecurity:    Worry: Not on file    Inability: Not on file  . Transportation needs:    Medical: Not on file    Non-medical: Not on file  Tobacco Use  . Smoking status: Current Some Day Smoker    Packs/day: 0.20    Types: Cigarettes  . Smokeless tobacco: Never Used  Substance and Sexual Activity  . Alcohol use: Yes  . Drug use: Yes    Types: Marijuana  . Sexual activity: Not on file  Lifestyle  . Physical activity:    Days per week: Not on file    Minutes per session: Not on file  . Stress: Not on file  Relationships  . Social connections:    Talks on phone: Not on  file    Gets together: Not on file    Attends religious service: Not on file    Active member of club or organization: Not on file    Attends meetings of clubs or organizations: Not on file    Relationship status: Not on file  . Intimate partner violence:    Fear of current or ex partner: Not on file    Emotionally abused: Not on file    Physically abused: Not on file    Forced sexual activity: Not on file  Other Topics Concern  . Not on file  Social History Narrative  . Not on file   History reviewed. No pertinent family history.   Mardella Layman, MD 02/09/18 1130
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
## 2019-03-27 ENCOUNTER — Telehealth: Payer: Self-pay

## 2019-03-27 NOTE — Telephone Encounter (Signed)
Called and spoke with patient for COVID 19 Screening. Patient had no risk factors and is cleared to come into office for appointment. Thanks! 

## 2019-03-28 ENCOUNTER — Ambulatory Visit: Payer: Self-pay | Admitting: Family Medicine

## 2019-11-05 ENCOUNTER — Other Ambulatory Visit: Payer: Self-pay | Admitting: Cardiology

## 2019-11-05 DIAGNOSIS — Z20822 Contact with and (suspected) exposure to covid-19: Secondary | ICD-10-CM

## 2019-11-07 LAB — NOVEL CORONAVIRUS, NAA: SARS-CoV-2, NAA: NOT DETECTED

## 2020-01-22 ENCOUNTER — Ambulatory Visit: Payer: Self-pay | Attending: Internal Medicine

## 2020-01-22 DIAGNOSIS — Z23 Encounter for immunization: Secondary | ICD-10-CM

## 2020-01-22 NOTE — Progress Notes (Signed)
   Covid-19 Vaccination Clinic  Name:  Gabriela Shannon    MRN: [id number] DOB: Jan 09, 1996  01/22/2020  [name] was observed post Covid-19 immunization for 15 minutes without incident. She was provided with Vaccine Information Sheet and instruction to access the V-Safe system.   [name] was instructed to call 911 with any severe reactions post vaccine: Marland Kitchen Difficulty breathing  . Swelling of face and throat  . A fast heartbeat  . A bad rash all over body  . Dizziness and weakness   Immunizations Administered    Name Date Dose VIS Date Route   Pfizer COVID-19 Vaccine 01/22/2020 12:55 PM 0.3 mL 10/10/2019 Intramuscular   Manufacturer: [employer], [employer]   Lot: [id number]   NDC: [address]-2

## 2020-02-16 ENCOUNTER — Ambulatory Visit: Payer: Self-pay

## 2020-06-09 ENCOUNTER — Ambulatory Visit (INDEPENDENT_AMBULATORY_CARE_PROVIDER_SITE_OTHER)
Admission: RE | Admit: 2020-06-09 | Discharge: 2020-06-09 | Disposition: A | Discharge status: Home / Self Care | Payer: Self-pay | Source: Ambulatory Visit

## 2020-06-09 DIAGNOSIS — L0291 Cutaneous abscess, unspecified: Secondary | ICD-10-CM

## 2020-06-09 MED ORDER — DOXYCYCLINE HYCLATE 100 MG PO CAPS: 100.0000 mg | ORAL_CAPSULE | Freq: Two times a day (BID) | ORAL | 0 refills | Status: DC

## 2020-06-09 NOTE — Discharge Instructions (Addendum)
Take the antibiotic as prescribed. Follow up as needed for continued or worsening symptoms

## 2020-06-09 NOTE — ED Provider Notes (Signed)
Virtual Visit via Video Note:  Gabriela Shannon  initiated request for Telemedicine visit with [facility] team. I connected with Gabriela Shannon  on 06/09/2020 at 10:14 AM  for a synchronized telemedicine visit using a video enabled HIPPA compliant telemedicine application. I verified that I am speaking with Gabriela Shannon  using two identifiers. Janace Aris, NP  was physically located in a [facility] Urgent care site and Gabriela Shannon was located at a different location.   The limitations of evaluation and management by telemedicine as well as the availability of in-person appointments were discussed. Patient was informed that she  may incur a bill ( including co-pay) for this virtual visit encounter. Gabriela Shannon  expressed understanding and gave verbal consent to proceed with virtual visit.     History of Present Illness:Gabriela Shannon  is a 24 y.o. female presents with abscess.  History of hidradenitis.  reporting multiple in axilla area and genitals. Some are draining. No fever.  This is a chronic issue.   Past Medical History:  Diagnosis Date   HPV (human papilloma virus) infection     No Known Allergies      Observations/Objective:VITALS: Per patient if applicable, see vitals. GENERAL: Alert, appears well and in no acute distress. HEENT: Atraumatic, conjunctiva clear, no obvious abnormalities on inspection of external nose and ears. NECK: Normal movements of the head and neck. CARDIOPULMONARY: No increased WOB. Speaking in clear sentences. I:E ratio WNL.  MS: Moves all visible extremities without noticeable abnormality. PSYCH: Pleasant and cooperative, well-groomed. Speech normal rate and rhythm. Affect is appropriate. Insight and judgement are appropriate. Attention is focused, linear, and appropriate.  NEURO: CN grossly intact. Oriented as arrived to appointment on time with no prompting. Moves both UE equally.  SKIN: No obvious lesions, wounds, erythema, or cyanosis  noted on face or hands.     Assessment and Plan: Treating with antibiotics for multiple abscesses. Recommended primary care for possible referral to specialist   Follow Up Instructions:Follow up as needed for continued or worsening symptoms'    I discussed the assessment and treatment plan with the patient. The patient was provided an opportunity to ask questions and all were answered. The patient agreed with the plan and demonstrated an understanding of the instructions.   The patient was advised to call back or seek an in-person evaluation if the symptoms worsen or if the condition fails to improve as anticipated.    Janace Aris, NP  06/09/2020 10:14 AM         Janace Aris, NP 06/09/20 1014

## 2021-10-07 ENCOUNTER — Other Ambulatory Visit: Payer: Self-pay

## 2021-10-07 ENCOUNTER — Telehealth: Payer: Self-pay | Admitting: Physician Assistant

## 2021-10-07 ENCOUNTER — Emergency Department (HOSPITAL_BASED_OUTPATIENT_CLINIC_OR_DEPARTMENT_OTHER)
Admission: EM | Admit: 2021-10-07 | Discharge: 2021-10-07 | Disposition: A | Discharge status: Home / Self Care | Payer: Self-pay | Attending: Emergency Medicine | Admitting: Emergency Medicine

## 2021-10-07 ENCOUNTER — Encounter (HOSPITAL_BASED_OUTPATIENT_CLINIC_OR_DEPARTMENT_OTHER): Payer: Self-pay | Admitting: Emergency Medicine

## 2021-10-07 DIAGNOSIS — Z3201 Encounter for pregnancy test, result positive: Secondary | ICD-10-CM | POA: Insufficient documentation

## 2021-10-07 DIAGNOSIS — L0291 Cutaneous abscess, unspecified: Secondary | ICD-10-CM

## 2021-10-07 DIAGNOSIS — F17210 Nicotine dependence, cigarettes, uncomplicated: Secondary | ICD-10-CM | POA: Insufficient documentation

## 2021-10-07 DIAGNOSIS — L02412 Cutaneous abscess of left axilla: Secondary | ICD-10-CM | POA: Insufficient documentation

## 2021-10-07 DIAGNOSIS — O99711 Diseases of the skin and subcutaneous tissue complicating pregnancy, first trimester: Secondary | ICD-10-CM | POA: Insufficient documentation

## 2021-10-07 DIAGNOSIS — Z91199 Patient's noncompliance with other medical treatment and regimen due to unspecified reason: Secondary | ICD-10-CM

## 2021-10-07 LAB — PREGNANCY, URINE: Preg Test, Ur: POSITIVE — AB

## 2021-10-07 MED ORDER — LIDOCAINE-EPINEPHRINE (PF) 2 %-1:200000 IJ SOLN
20.0000 mL | Freq: Once | INTRAMUSCULAR | Status: AC
  Administered 2021-10-07: 20 mL via INTRADERMAL
  Filled 2021-10-07: qty 20

## 2021-10-07 MED ORDER — CLINDAMYCIN HCL 300 MG PO CAPS: 300.0000 mg | ORAL_CAPSULE | Freq: Three times a day (TID) | ORAL | 0 refills | Status: AC

## 2021-10-07 MED ORDER — ACETAMINOPHEN 325 MG PO TABS
650.0000 mg | ORAL_TABLET | Freq: Once | ORAL | Status: AC
  Administered 2021-10-07: 650 mg via ORAL
  Filled 2021-10-07: qty 2

## 2021-10-07 MED ORDER — CLINDAMYCIN HCL 300 MG PO CAPS
300.0000 mg | ORAL_CAPSULE | Freq: Three times a day (TID) | ORAL | 0 refills | Status: DC
  Filled 2021-10-07: qty 21, 7d supply, fill #0

## 2021-10-07 MED ORDER — LIDOCAINE-EPINEPHRINE-TETRACAINE (LET) TOPICAL GEL
3.0000 mL | Freq: Once | TOPICAL | Status: AC
  Administered 2021-10-07: 3 mL via TOPICAL
  Filled 2021-10-07: qty 3
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
## 2021-10-07 NOTE — Discharge Instructions (Signed)
You were given a prescription for antibiotics. Please take the antibiotic prescription fully.   Use warm compressess   Start taking over the counter prenatal vitamins  [facility]  Please return to the emergency department for any new or worsening symptoms.

## 2021-10-07 NOTE — Progress Notes (Signed)
The patient no-showed for appointment despite this provider sending direct link and waiting for at least 10 minutes from appointment time for patient to join. They will be marked as a NS for this appointment/time.   Amiria Orrison M Versia Mignogna, PA-C    

## 2021-10-07 NOTE — ED Triage Notes (Signed)
Pt arrives pov with c/o right side axillary abscesses x 5 days

## 2021-10-07 NOTE — ED Notes (Signed)
PA at Bedside. 

## 2021-10-07 NOTE — ED Notes (Signed)
Discharge instructions discussed with pt. Pt verbalized understanding. Pt stable and ambulatory.  °

## 2021-10-10 ENCOUNTER — Other Ambulatory Visit (HOSPITAL_BASED_OUTPATIENT_CLINIC_OR_DEPARTMENT_OTHER): Payer: Self-pay

## 2021-11-04 ENCOUNTER — Ambulatory Visit: Payer: Self-pay | Attending: Internal Medicine

## 2021-11-04 ENCOUNTER — Other Ambulatory Visit: Payer: Self-pay

## 2021-11-04 ENCOUNTER — Other Ambulatory Visit (HOSPITAL_BASED_OUTPATIENT_CLINIC_OR_DEPARTMENT_OTHER): Payer: Self-pay

## 2021-11-04 DIAGNOSIS — Z23 Encounter for immunization: Secondary | ICD-10-CM

## 2021-11-04 MED ORDER — PFIZER-BIONT COVID-19 VAC-TRIS 30 MCG/0.3ML IM SUSP
INTRAMUSCULAR | 0 refills | Status: AC
  Filled 2021-11-04: qty 0.3, 1d supply, fill #0

## 2021-11-04 NOTE — Progress Notes (Signed)
° °  Covid-19 Vaccination Clinic  Name:  Gabriela Shannon    MRN: [id number] DOB: 12/06/1995  11/04/2021  Ms. Way was observed post Covid-19 immunization for 15 minutes without incident. She was provided with Vaccine Information Sheet and instruction to access the V-Safe system.   [name] was instructed to call 911 with any severe reactions post vaccine: Difficulty breathing  Swelling of face and throat  A fast heartbeat  A bad rash all over body  Dizziness and weakness   Immunizations Administered     Name Date Dose VIS Date Route   PFIZER Comrnaty(Gray TOP) Covid-19 Vaccine 11/04/2021  1:15 PM 0.3 mL 06/29/2021 Intramuscular   Manufacturer: [employer], [employer]   Lot: [id number]   NDC: [phone]

## 2022-03-13 ENCOUNTER — Other Ambulatory Visit (HOSPITAL_COMMUNITY): Payer: Self-pay
